# Patient Record
Sex: Male | Born: 1995 | Race: White | Hispanic: No | Marital: Single
Health system: Southern US, Community
[De-identification: ages and names within clinical notes are randomized; demographics above are authoritative.]

---
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
## 2014-06-30 ENCOUNTER — Other Ambulatory Visit: Payer: Self-pay | Admitting: Gastroenterology

## 2014-06-30 DIAGNOSIS — R109 Unspecified abdominal pain: Secondary | ICD-10-CM

## 2014-06-30 DIAGNOSIS — K5900 Constipation, unspecified: Secondary | ICD-10-CM

## 2014-07-05 ENCOUNTER — Ambulatory Visit
Admission: RE | Admit: 2014-07-05 | Discharge: 2014-07-05 | Disposition: A | Discharge status: Home / Self Care | Payer: BC Managed Care – PPO | Source: Ambulatory Visit | Attending: Gastroenterology | Admitting: Gastroenterology

## 2014-07-05 DIAGNOSIS — R109 Unspecified abdominal pain: Secondary | ICD-10-CM

## 2014-07-05 DIAGNOSIS — K5900 Constipation, unspecified: Secondary | ICD-10-CM

## 2014-07-05 MED ORDER — IOHEXOL 300 MG/ML  SOLN
100.0000 mL | Freq: Once | INTRAMUSCULAR | Status: AC | PRN
  Administered 2014-07-05: 100 mL via INTRAVENOUS

## 2015-01-24 IMAGING — CT CT ABD-PELV W/ CM
2 of 4 series · 10 of 36 positions shown, 17 images · IV contrast (READICAT/WATER & [ID] OMNI 300)
Comparison: None.

CLINICAL DATA: Abdominal pain and constipation for 1 [DATE] years.

EXAM:
CT ABDOMEN AND PELVIS WITH CONTRAST
TECHNIQUE: Multidetector CT imaging of the abdomen and pelvis was performed
using the standard protocol following bolus administration of
intravenous contrast.
CONTRAST:  100mL OMNIPAQUE IOHEXOL 300 MG/ML  SOLN

[Series 3: abd/pelvis with · axial · 0.70mm/px · z∈[-414,-64]mm · 9 of 88 slices shown, 15 images]
[im 9/88  soft-tissue]
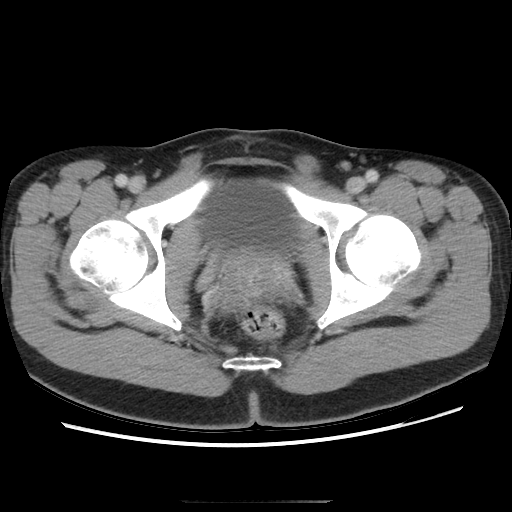
[im 9/88  bone]
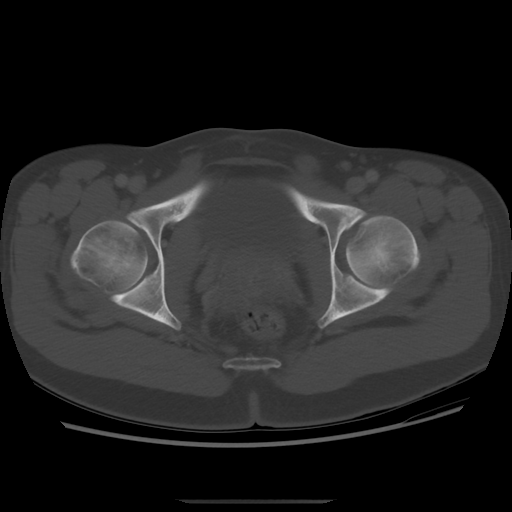
[im 18/88  soft-tissue]
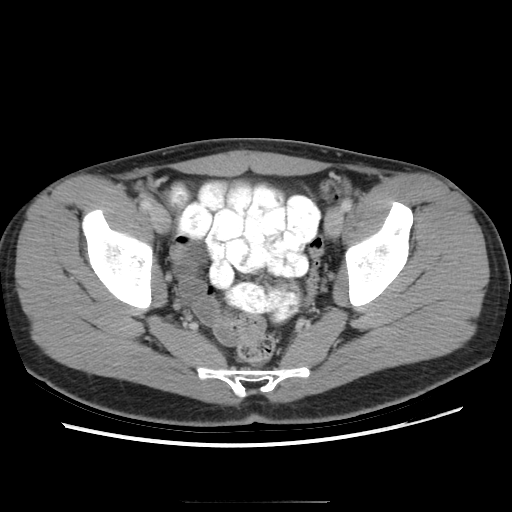
[im 27/88  soft-tissue]
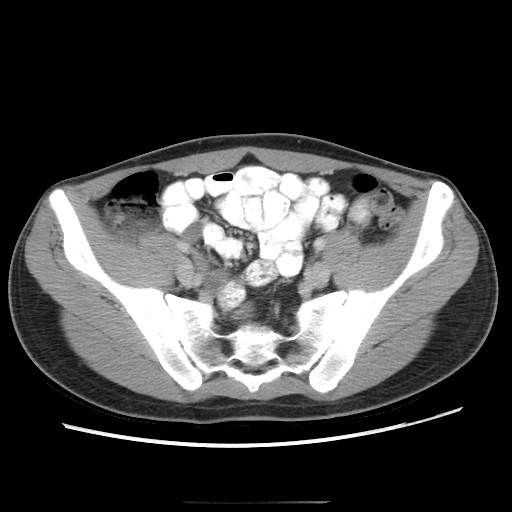
[im 35/88  soft-tissue]
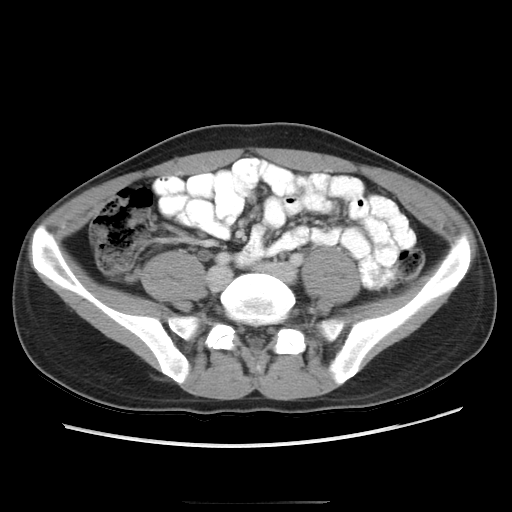
[im 44/88  soft-tissue]
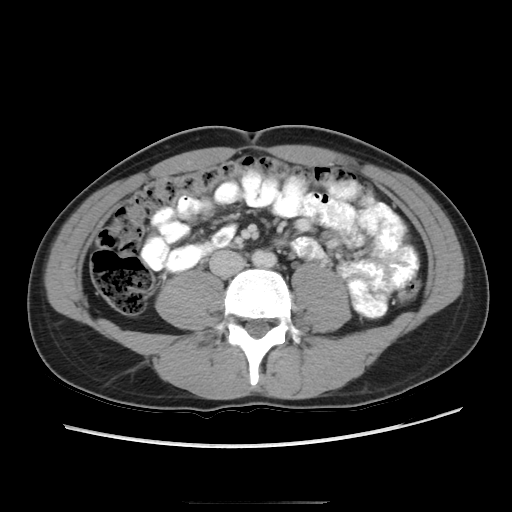
[im 53/88  soft-tissue]
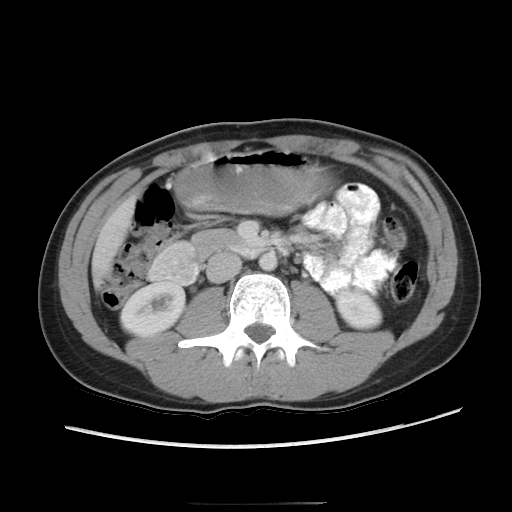
[im 53/88  lung]
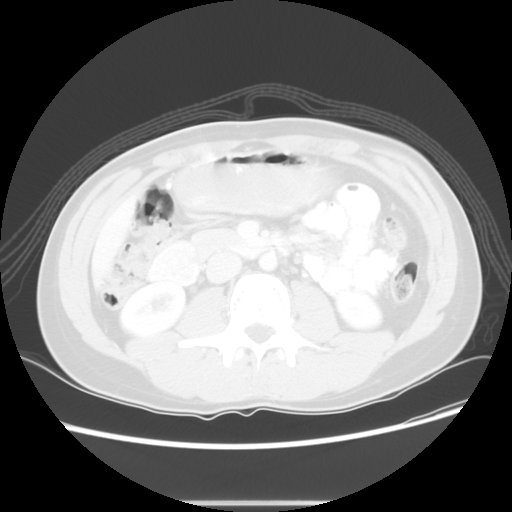
[im 61/88  soft-tissue]
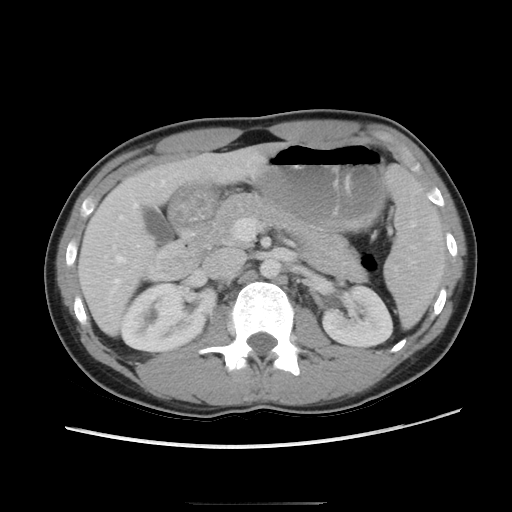
[im 61/88  lung]
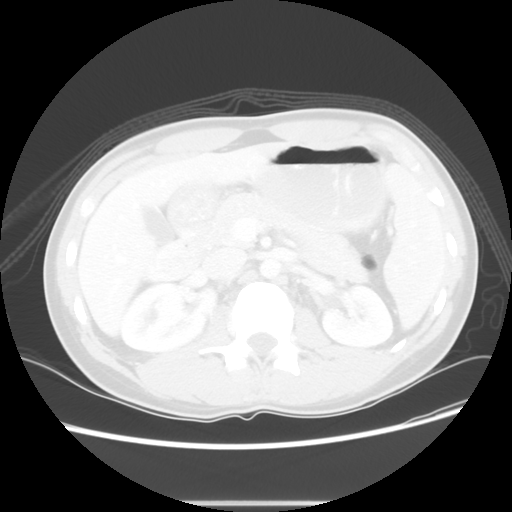
[im 70/88  soft-tissue]
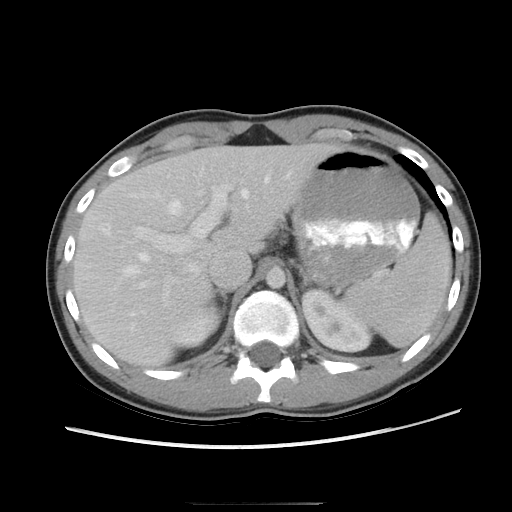
[im 70/88  lung]
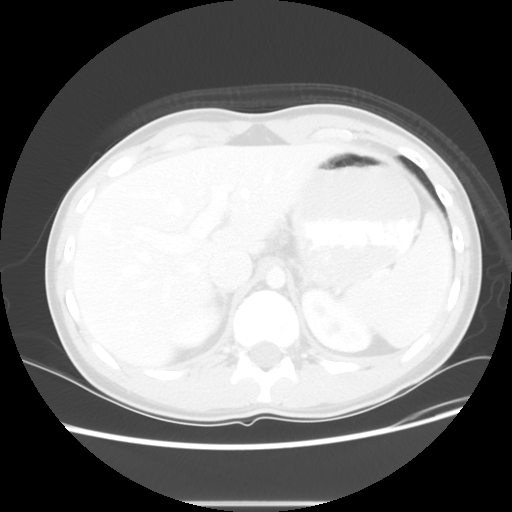
[im 79/88  soft-tissue]
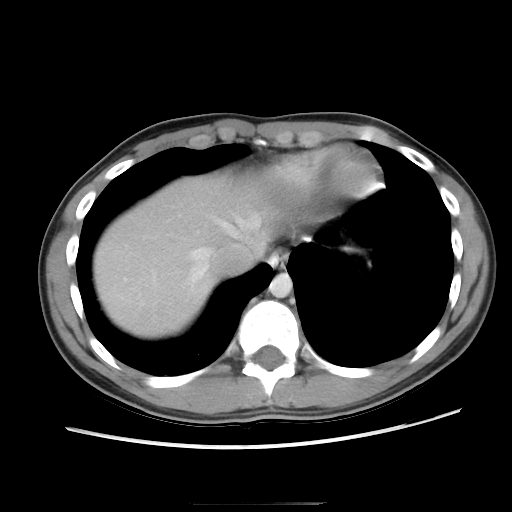
[im 79/88  lung]
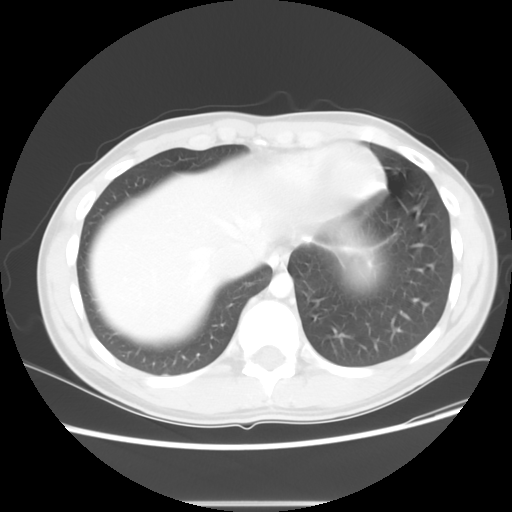
[im 79/88  bone]
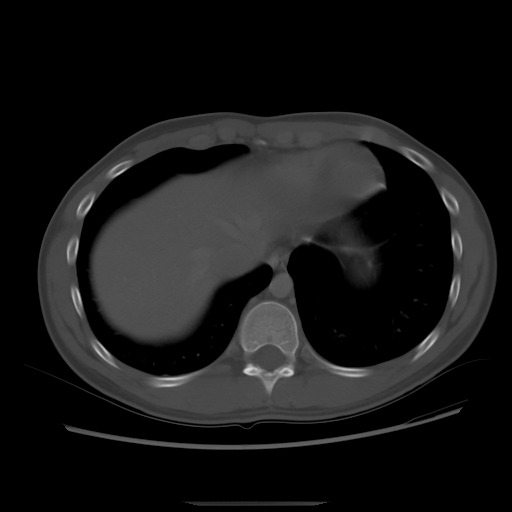

[Series 601: coronal body · coronal · 0.94mm/px · 1 of 99 slices shown, 2 images]
[im 33/99  soft-tissue]
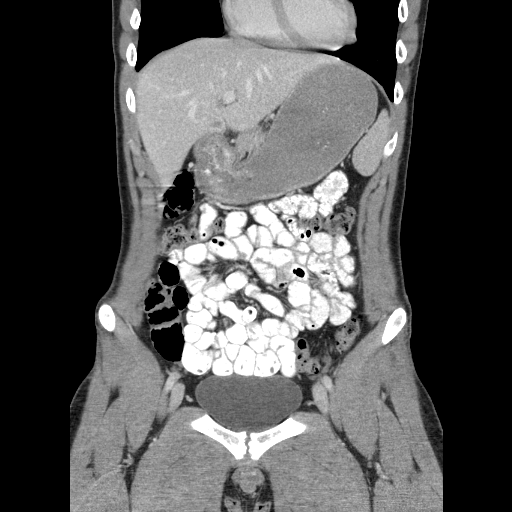
[im 33/99  bone]
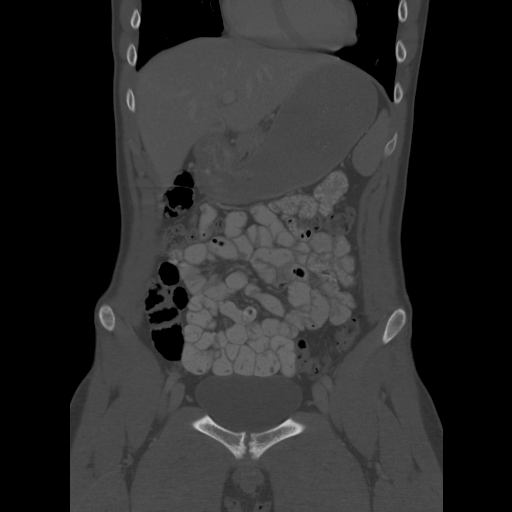

[10 of 36 positions shown; findings below may reference images not displayed]

FINDINGS: The lung bases are clear.  No pleural or pericardial effusion.

No liver abnormality. The gallbladder appears normal. No biliary
dilatation. Normal appearance of the pancreas. The spleen is on
unremarkable.

The adrenal glands are both normal. Normal appearance of the left
kidney. The right kidney is also normal. The urinary bladder appears
normal. Prostate gland and seminal vesicles are unremarkable.

There is no free fluid or fluid collections within the abdomen or
pelvis. Normal caliber of the abdominal aorta. No aneurysm. No upper
abdominal adenopathy. There is no pelvic or inguinal adenopathy.

The stomach is normal. The small bowel loops have a normal course
and caliber. There is no evidence for bowel obstruction. Normal
appearance of the colon average stool volume identified.

Review of the visualized bony structures is negative.
IMPRESSION: 1. No acute findings within the abdomen or pelvis. No explanation
for abdominal pain and constipation

## 2016-11-14 ENCOUNTER — Encounter: Payer: Self-pay | Admitting: Internal Medicine
